# Patient Record
Sex: Female | Born: 1955
Health system: Southern US, Community
[De-identification: ages and names within clinical notes are randomized; demographics above are authoritative.]

## PROBLEM LIST (undated history)

## (undated) DIAGNOSIS — IMO0002 Reserved for concepts with insufficient information to code with codable children: Secondary | ICD-10-CM

## (undated) HISTORY — PX: BLADDER SURGERY: SHX569

## (undated) HISTORY — PX: OTHER SURGICAL HISTORY: SHX169

## (undated) HISTORY — PX: SMALL INTESTINE SURGERY: SHX150

## (undated) HISTORY — PX: ABDOMINAL HYSTERECTOMY: SHX81

---

## 2012-01-13 ENCOUNTER — Emergency Department (HOSPITAL_COMMUNITY)
Admission: EM | Admit: 2012-01-13 | Discharge: 2012-01-13 | Disposition: A | Discharge status: Home / Self Care | Payer: Self-pay | Attending: Emergency Medicine | Admitting: Emergency Medicine

## 2012-01-13 ENCOUNTER — Encounter (HOSPITAL_COMMUNITY): Payer: Self-pay | Admitting: Emergency Medicine

## 2012-01-13 ENCOUNTER — Emergency Department (HOSPITAL_COMMUNITY): Payer: Self-pay

## 2012-01-13 DIAGNOSIS — K089 Disorder of teeth and supporting structures, unspecified: Secondary | ICD-10-CM | POA: Insufficient documentation

## 2012-01-13 DIAGNOSIS — IMO0002 Reserved for concepts with insufficient information to code with codable children: Secondary | ICD-10-CM | POA: Insufficient documentation

## 2012-01-13 DIAGNOSIS — K029 Dental caries, unspecified: Secondary | ICD-10-CM | POA: Insufficient documentation

## 2012-01-13 DIAGNOSIS — J3489 Other specified disorders of nose and nasal sinuses: Secondary | ICD-10-CM | POA: Insufficient documentation

## 2012-01-13 DIAGNOSIS — R221 Localized swelling, mass and lump, neck: Secondary | ICD-10-CM | POA: Insufficient documentation

## 2012-01-13 DIAGNOSIS — R220 Localized swelling, mass and lump, head: Secondary | ICD-10-CM | POA: Insufficient documentation

## 2012-01-13 DIAGNOSIS — K046 Periapical abscess with sinus: Secondary | ICD-10-CM | POA: Insufficient documentation

## 2012-01-13 DIAGNOSIS — R509 Fever, unspecified: Secondary | ICD-10-CM | POA: Insufficient documentation

## 2012-01-13 DIAGNOSIS — R110 Nausea: Secondary | ICD-10-CM | POA: Insufficient documentation

## 2012-01-13 HISTORY — DX: Reserved for concepts with insufficient information to code with codable children: IMO0002

## 2012-01-13 LAB — BASIC METABOLIC PANEL
BUN: 13 mg/dL (ref 6–23)
BUN: 12 mg/dL (ref 6–23)
CO2: 25 mEq/L (ref 19–32)
Chloride: 96 mEq/L (ref 96–112)
Creatinine, Ser: 0.96 mg/dL (ref 0.50–1.10)
Creatinine, Ser: 0.92 mg/dL (ref 0.50–1.10)
GFR calc Af Amer: 79 mL/min — ABNORMAL LOW (ref >90)
GFR calc non Af Amer: 68 mL/min — ABNORMAL LOW (ref >90)
Glucose, Bld: 165 mg/dL — ABNORMAL HIGH (ref 70–99)
Glucose, Bld: 130 mg/dL — ABNORMAL HIGH (ref 70–99)

## 2012-01-13 LAB — CBC WITH DIFFERENTIAL/PLATELET
Basophils Relative: 0 % (ref 0–1)
HCT: 36.7 % (ref 36.0–46.0)
Hemoglobin: 12.5 g/dL (ref 12.0–15.0)
Lymphs Abs: 0.7 10*3/uL (ref 0.7–4.0)
MCHC: 34.1 g/dL (ref 30.0–36.0)
Monocytes Absolute: 1 10*3/uL (ref 0.1–1.0)
Monocytes Relative: 10 % (ref 3–12)
Neutro Abs: 8.4 10*3/uL — ABNORMAL HIGH (ref 1.7–7.7)
RBC: 4.1 MIL/uL (ref 3.87–5.11)

## 2012-01-13 LAB — CBC
HCT: 35.7 % — ABNORMAL LOW (ref 36.0–46.0)
Hemoglobin: 12.2 g/dL (ref 12.0–15.0)
MCH: 30.7 pg (ref 26.0–34.0)
MCHC: 34.2 g/dL (ref 30.0–36.0)
MCV: 89.7 fL (ref 78.0–100.0)
RDW: 13.2 % (ref 11.5–15.5)

## 2012-01-13 MED ORDER — SODIUM CHLORIDE 0.9 % IV SOLN: INTRAVENOUS | Status: DC

## 2012-01-13 MED ORDER — SODIUM CHLORIDE 0.9 % IV BOLUS (SEPSIS)
1000.0000 mL | Freq: Once | INTRAVENOUS | Status: AC
  Administered 2012-01-13: 1000 mL via INTRAVENOUS

## 2012-01-13 MED ORDER — IBUPROFEN 400 MG PO TABS
600.0000 mg | ORAL_TABLET | Freq: Once | ORAL | Status: AC
  Administered 2012-01-13: 600 mg via ORAL
  Filled 2012-01-13: qty 1

## 2012-01-13 MED ORDER — MORPHINE SULFATE 4 MG/ML IJ SOLN
4.0000 mg | Freq: Once | INTRAMUSCULAR | Status: AC
  Administered 2012-01-13: 4 mg via INTRAVENOUS
  Filled 2012-01-13: qty 1

## 2012-01-13 MED ORDER — ACETAMINOPHEN 325 MG PO TABS
650.0000 mg | ORAL_TABLET | Freq: Once | ORAL | Status: AC
  Administered 2012-01-13: 650 mg via ORAL

## 2012-01-13 MED ORDER — SODIUM CHLORIDE 0.9 % IV SOLN
3.0000 g | Freq: Once | INTRAVENOUS | Status: AC
  Administered 2012-01-13: 3 g via INTRAVENOUS
  Filled 2012-01-13 (×2): qty 3

## 2012-01-13 MED ORDER — ACETAMINOPHEN 325 MG PO TABS
ORAL_TABLET | ORAL | Status: AC
  Filled 2012-01-13: qty 2

## 2012-01-13 MED ORDER — IOHEXOL 300 MG/ML  SOLN
80.0000 mL | Freq: Once | INTRAMUSCULAR | Status: AC | PRN
  Administered 2012-01-13: 80 mL via INTRAVENOUS

## 2012-01-13 NOTE — ED Provider Notes (Signed)
History    This chart was scribed for Pamela Roots, MD, MD by Smitty Pluck. The patient was seen in room TR07C and the patient's care was started at 5:41PM.   CSN: [id number]  Arrival date & time 01/13/12  1505   None     Chief Complaint  Patient presents with  . Fever  . Dental Pain    (Consider location/radiation/quality/duration/timing/severity/associated sxs/prior treatment) The history is provided by the patient.   Pamela Wyatt is a 56 y.o. female who presents to the Emergency Department complaining of moderate oral infection and right side facial swelling with fever onset 3 days ago but with symptoms worsening today. Pt reports having sinus pressure and pain recently. Reports that she went to dentist in [place] 2 weeks ago due to dental pain on right side but x-rays were negative for infection/dental abscess. . Pt reports that symptoms have been constant. Denies cough, emesis and diarrhea. Reports having nausea. Other than recent dental pain and recent sinus congestion/pain, denies other recent symptoms or illness. No headache. No neck pain or stiffness. No sore throat or cough. No cp or sob. No abd pain. No nvd. No gu c/o.   Past Medical History  Diagnosis Date  . Herniated disc     History reviewed. No pertinent past surgical history.  History reviewed. No pertinent family history.  History  Substance Use Topics  . Smoking status: Never Smoker   . Smokeless tobacco: Not on file  . Alcohol Use: No    OB History    Grav Para Term Preterm Abortions TAB SAB Ect Mult Living                  Review of Systems  Constitutional: Positive for fever. Negative for chills.  HENT: Positive for congestion, dental problem and sinus pressure. Negative for sore throat, neck pain and neck stiffness.   Eyes: Negative for redness and visual disturbance.  Respiratory: Negative for cough and shortness of breath.   Cardiovascular: Negative for chest pain.  Gastrointestinal:  Negative for nausea, vomiting and abdominal pain.  Genitourinary: Negative for dysuria.  Skin: Negative for rash.  Neurological: Negative for weakness and headaches.    Allergies  Sulfa antibiotics  Home Medications  No current outpatient prescriptions on file.  BP 108/69  Pulse 121  Temp 102.6 F (39.2 C) (Oral)  Resp 16  SpO2 98%  Physical Exam  Nursing note and vitals reviewed. Constitutional: She is oriented to person, place, and time. She appears well-developed and well-nourished. No distress.  HENT:  Head: Normocephalic and atraumatic.  Nose: Nose normal.  Mouth/Throat: Oropharynx is clear and moist.       Teeth 2 and 7 with decay and broken off  mild swelling to adjacent gum area. No trismus. Pharynx normal. No swelling or erythema. No post pharyngeal, floor of mouth, or neck swelling/tenderness. Moderate right facial swelling extending from just beneath right eye to lower cheek. No discrete area of fluctuance/abscess felt.   Eyes: Conjunctivae and EOM are normal. Pupils are equal, round, and reactive to light.  Neck: Neck supple. No tracheal deviation present. No thyromegaly present.       No stiffness or rigidity  Cardiovascular: Regular rhythm, normal heart sounds and intact distal pulses.   Pulmonary/Chest: Effort normal and breath sounds normal. No respiratory distress.  Abdominal: Soft. She exhibits no distension. There is no tenderness.  Genitourinary:       No cva tenderness  Musculoskeletal: Normal range of motion. She  exhibits no edema and no tenderness.  Lymphadenopathy:    She has no cervical adenopathy.  Neurological: She is alert and oriented to person, place, and time.  Skin: Skin is warm and dry. No rash noted.  Psychiatric: She has a normal mood and affect. Her behavior is normal.    ED Course  Procedures (including critical care time) DIAGNOSTIC STUDIES: Oxygen Saturation is 98% on room air, normal by my interpretation.    COORDINATION OF  CARE: 5:52PM EDP ordered medication: tylenol 650 mg, 0.9% NaCl infusion, Unasyn 3 g  Results for orders placed during the hospital encounter of 01/13/12  CBC WITH DIFFERENTIAL      Component Value Range   WBC 10.2  4.0 - 10.5 K/uL   RBC 4.10  3.87 - 5.11 MIL/uL   Hemoglobin 12.5  12.0 - 15.0 g/dL   HCT 40.9  81.1 - 91.4 %   MCV 89.5  78.0 - 100.0 fL   MCH 30.5  26.0 - 34.0 pg   MCHC 34.1  30.0 - 36.0 g/dL   RDW 78.2  95.6 - 21.3 %   Platelets 181  150 - 400 K/uL   Neutrophils Relative 82 (*) 43 - 77 %   Neutro Abs 8.4 (*) 1.7 - 7.7 K/uL   Lymphocytes Relative 7 (*) 12 - 46 %   Lymphs Abs 0.7  0.7 - 4.0 K/uL   Monocytes Relative 10  3 - 12 %   Monocytes Absolute 1.0  0.1 - 1.0 K/uL   Eosinophils Relative 1  0 - 5 %   Eosinophils Absolute 0.1  0.0 - 0.7 K/uL   Basophils Relative 0  0 - 1 %   Basophils Absolute 0.0  0.0 - 0.1 K/uL  BASIC METABOLIC PANEL      Component Value Range   Sodium 135  135 - 145 mEq/L   Potassium 3.8  3.5 - 5.1 mEq/L   Chloride 96  96 - 112 mEq/L   CO2 25  19 - 32 mEq/L   Glucose, Bld 165 (*) 70 - 99 mg/dL   BUN 13  6 - 23 mg/dL   Creatinine, Ser 0.86  0.50 - 1.10 mg/dL   Calcium 9.3  8.4 - 57.8 mg/dL   GFR calc non Af Amer 65 (*) >90 mL/min   GFR calc Af Amer 75 (*) >90 mL/min      MDM  I personally performed the services described in this documentation, which was scribed in my presence. The recorded information has been reviewed and considered. Pamela Roots, MD  Given fever, signif facial swelling, and tachycardia, will get ct mf r/o abscess.   Confirmed only allergy is sulfa. unasyn iv.   Discussed CDU PA Laveda Norman - given fever, tachycardia, ct findings of periapical abscess w erosion into maxillary sinus, will discussed w Oral Surgery/Maxillofacial, possibly admit for iv abx, surgical drainage.     Pamela Roots, MD 01/13/12 2025

## 2012-01-13 NOTE — ED Notes (Signed)
Pt noted to have swelling to right side of face and dental pain x 3 days; pt with fever

## 2012-01-13 NOTE — ED Provider Notes (Signed)
Assumed pt care in CDU.  Pt presents with dental pain, facial swelling and fever.  Maxillofacial CT ordered to evaluate extent of infection.  Unasyn abx and IVF given.  Will recheck VS, monitor, and consider further care once CT resulted.    7:01 PM On exam, pt with moderate edema noted to R maxillofacial region with tenderness to her R upper molar, premolar.  No pain with eye movement, no rash noted.  Pt able to speak without difficulty, no trismus.     8:34 PM I have consulted with oral surgeon Dr. Chales Salmon, who recommend having pt f/u in office tomorrow at 8am for further care.  Plan to continue with IV antibiotic and fluid, and will d/c pt once VS stable.  Pt currently in NAD, able to tolerates PO, and appears nontoxic.  Pain medication given.  Pt agrees with plan.    8:58 PM VSS, in NAD.    BP 102/66  Pulse 97  Temp 98.2 F (36.8 C) (Oral)  Resp 18  SpO2 98%  11:04 PM Pt stable to be discharged.  Pt to f/u with Dr. Chales Salmon tomorrow.    Fayrene Helper, PA-C 01/13/12 2304

## 2012-01-14 NOTE — ED Provider Notes (Signed)
Medical screening examination/treatment/procedure(s) were conducted as a shared visit with non-physician practitioner(s) and myself.  I personally evaluated the patient during the encounter See H and P  Suzi Roots, MD 01/14/12 1558

## 2013-10-01 IMAGING — CT CT MAXILLOFACIAL W/ CM
3 series · 16 of 47 positions shown, 19 images · IV contrast (80ml omni 300)
Comparison: None.

CLINICAL DATA: Right facial swelling and fever.

CT MAXILLOFACIAL WITH CONTRAST
TECHNIQUE: Multidetector CT imaging of the maxillofacial
structures was performed with intravenous contrast. Multiplanar CT
image reconstructions were also generated.
Contrast: 80mL OMNIPAQUE IOHEXOL 300 MG/ML  SOLN

[Series 3: recon 2: supine facial bones · axial · 0.36mm/px · z∈[+7,+157]mm · 10 of 66 slices shown, 13 images]
[im 5/66  brain]
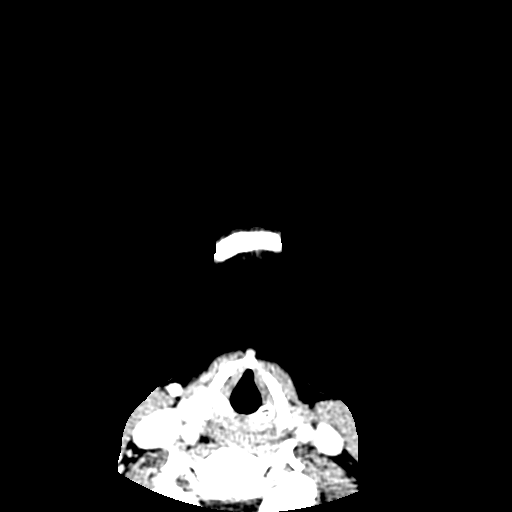
[im 5/66  bone]
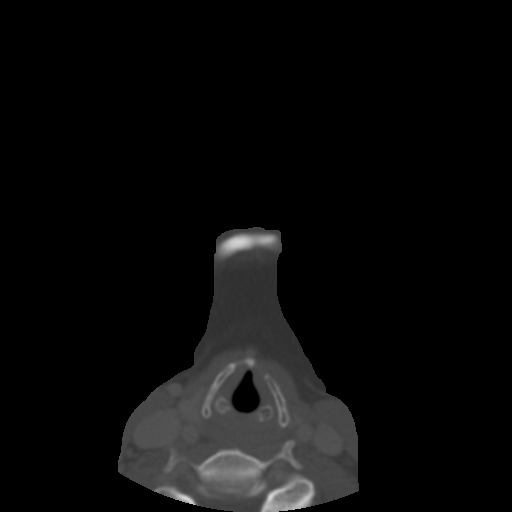
[im 12/66  bone]
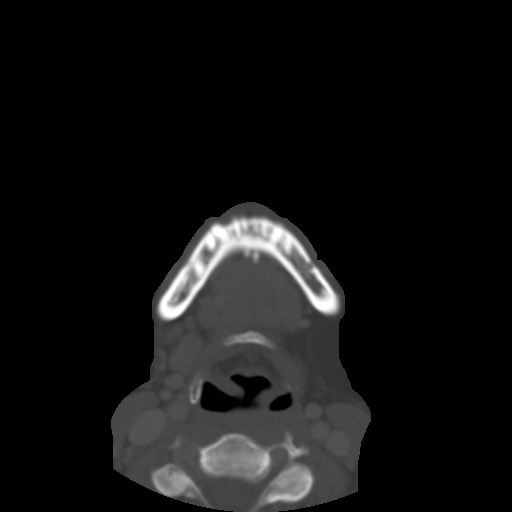
[im 18/66  bone]
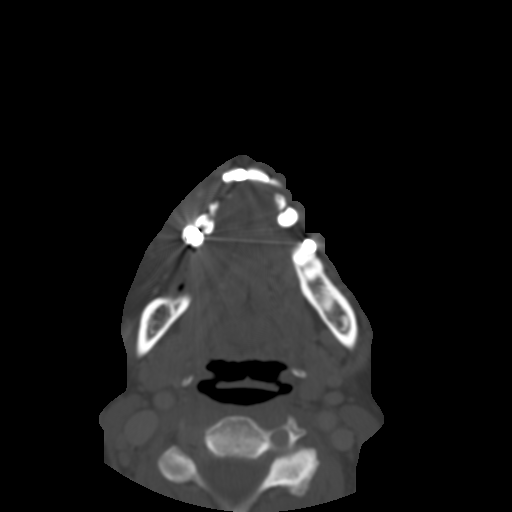
[im 23/66  bone]
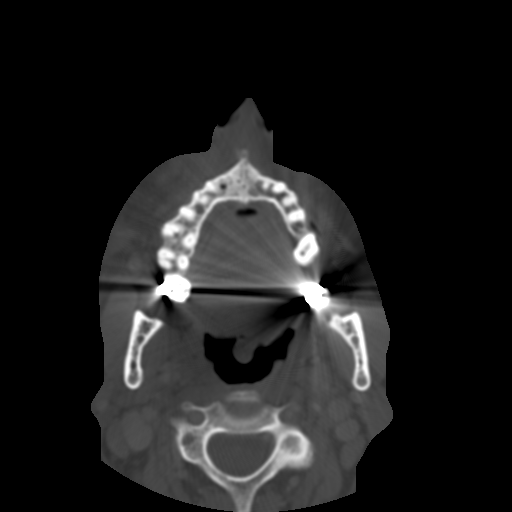
[im 30/66  brain]
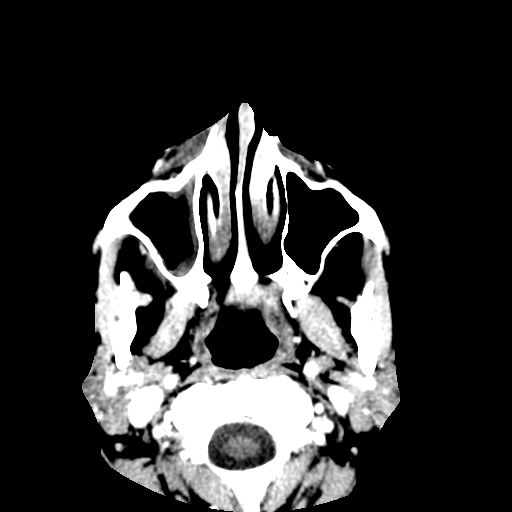
[im 30/66  bone]
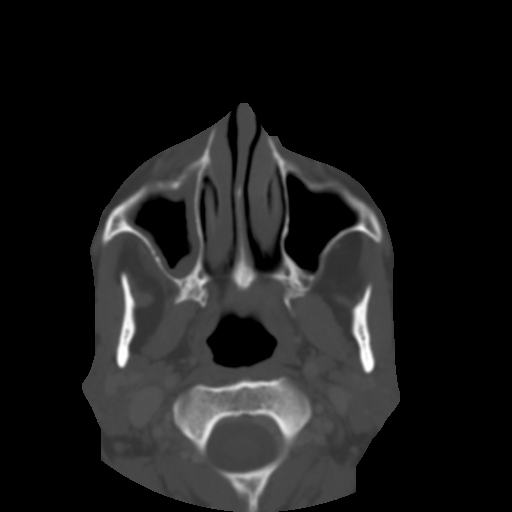
[im 36/66  bone]
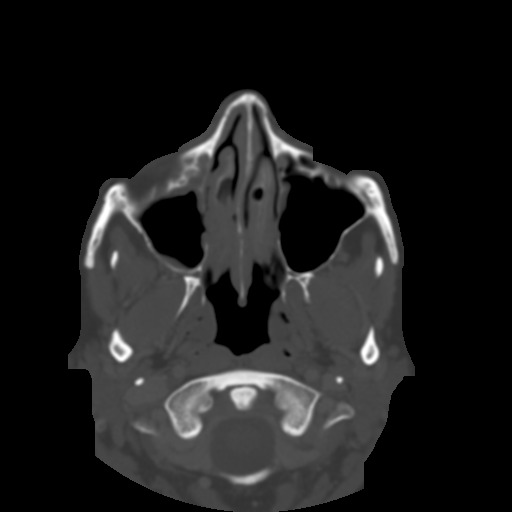
[im 43/66  bone]
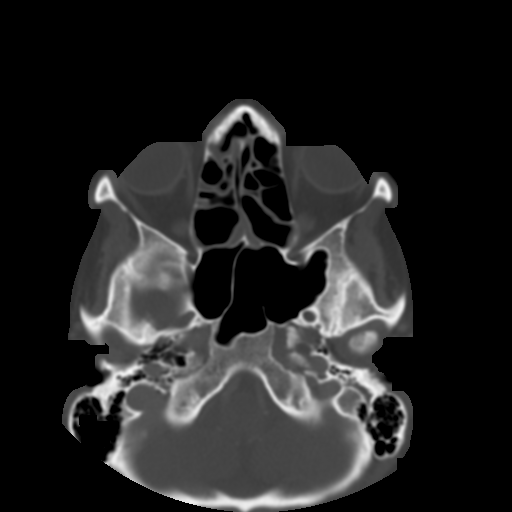
[im 50/66  bone]
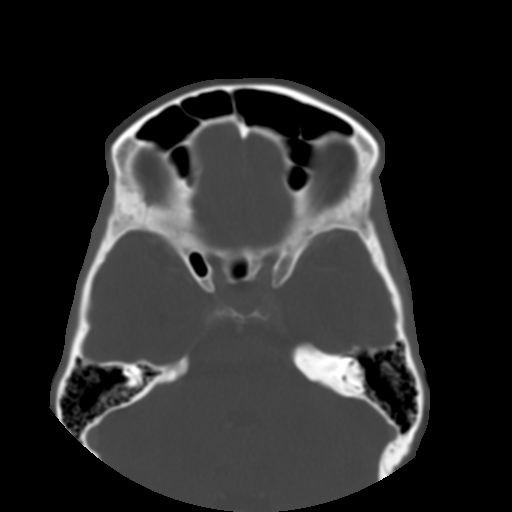
[im 54/66  brain]
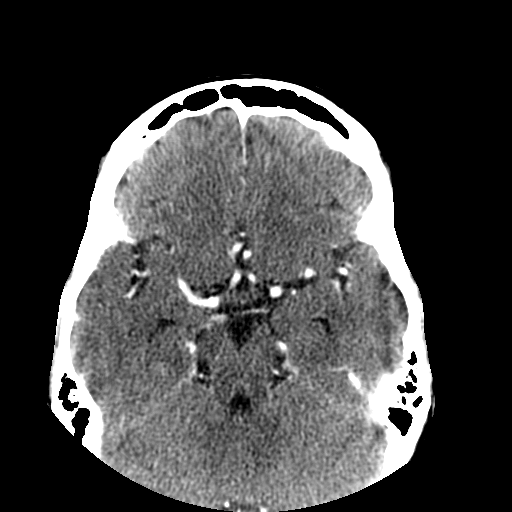
[im 54/66  bone]
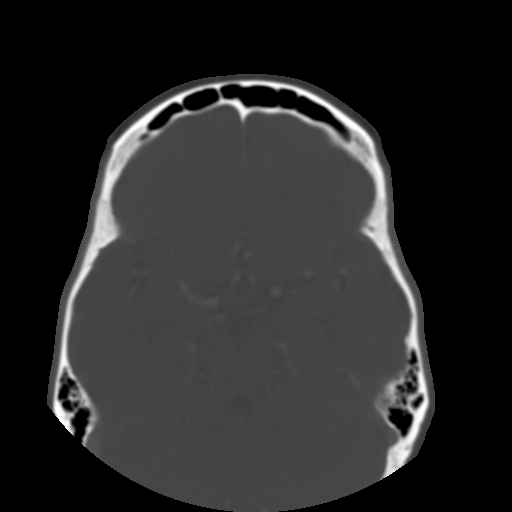
[im 61/66  bone]
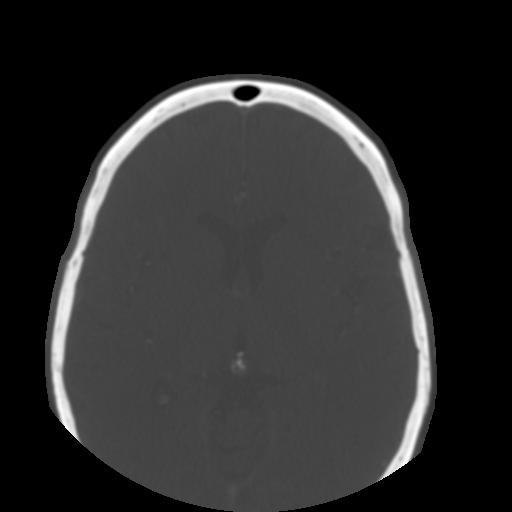

[Series 103: sagittal detail · sagittal · 0.36mm/px · 3 of 49 slices shown]
[im 17/49  bone]
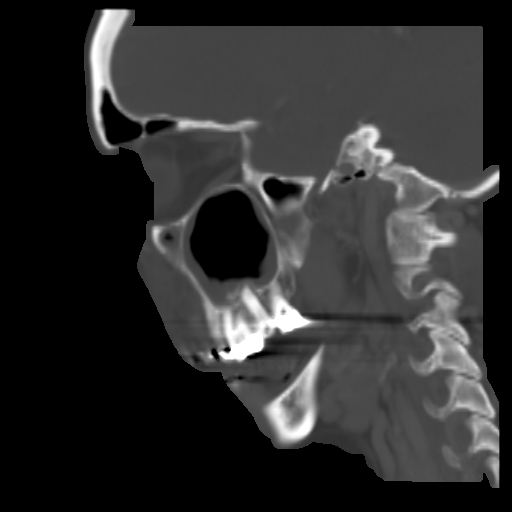
[im 25/49  bone]
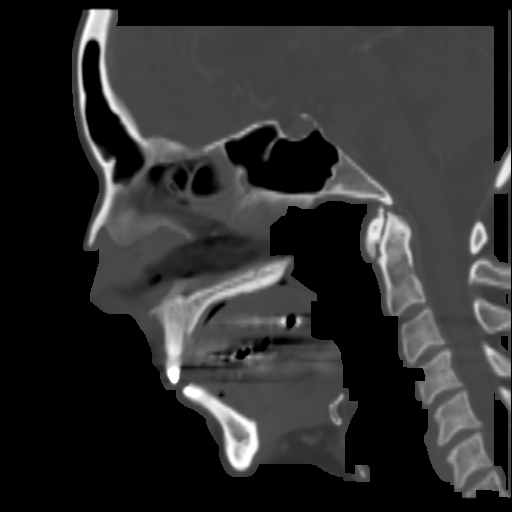
[im 33/49  bone]
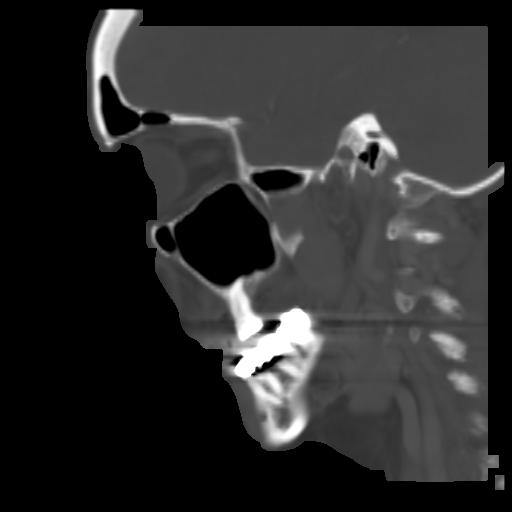

[Series 104: coronal detail · coronal · 0.36mm/px · 3 of 49 slices shown]
[im 17/49  bone]
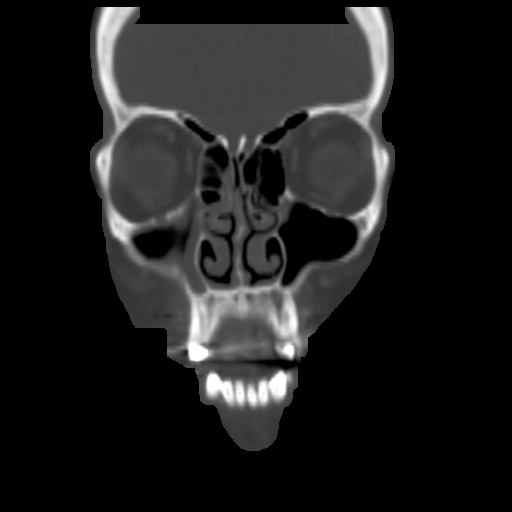
[im 22/49  bone]
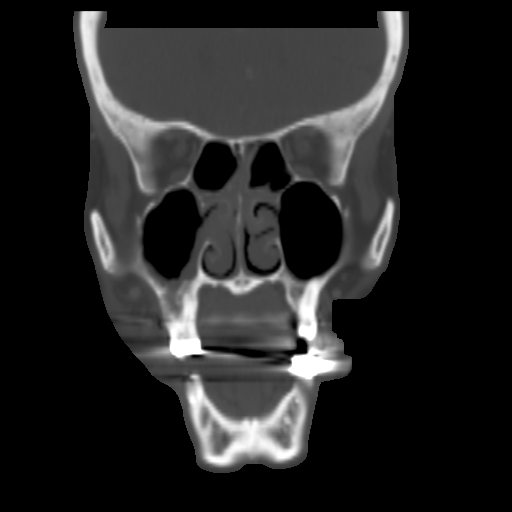
[im 27/49  bone]
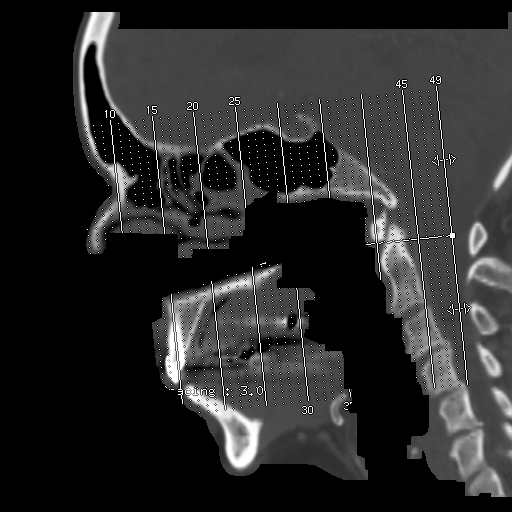

[16 of 47 positions shown; findings below may reference images not displayed]

FINDINGS: The patient has periapical abscesses involving the roots
of teeth #2 and #3 with disruption of the cortex overlying the
roots of the teeth into the right maxillary sinus where there is
reactive mucosal thickening.

The patient has multiple missing teeth.

There is minimal mucosal thickening in the right ethmoid air cells.
The other paranasal sinuses are clear.

No acute abnormality of the mandible.

Slight reactive adenopathy at the right mandibular angle.  Parotid
and submandibular glands are normal.
IMPRESSION: Periapical abscesses involving the roots of teeth #2 and #3 with
disruption of the cortex into the right maxillary sinus.

## 2019-12-14 DIAGNOSIS — R6889 Other general symptoms and signs: Secondary | ICD-10-CM | POA: Diagnosis not present

## 2020-02-02 DIAGNOSIS — Z135 Encounter for screening for eye and ear disorders: Secondary | ICD-10-CM | POA: Diagnosis not present

## 2020-02-02 DIAGNOSIS — H5200 Hypermetropia, unspecified eye: Secondary | ICD-10-CM | POA: Diagnosis not present

## 2020-02-02 DIAGNOSIS — R6889 Other general symptoms and signs: Secondary | ICD-10-CM | POA: Diagnosis not present

## 2020-02-02 DIAGNOSIS — Z0100 Encounter for examination of eyes and vision without abnormal findings: Secondary | ICD-10-CM | POA: Diagnosis not present

## 2020-02-02 DIAGNOSIS — I10 Essential (primary) hypertension: Secondary | ICD-10-CM | POA: Diagnosis not present

## 2020-02-28 DIAGNOSIS — R6889 Other general symptoms and signs: Secondary | ICD-10-CM | POA: Diagnosis not present

## 2020-03-20 DIAGNOSIS — R6889 Other general symptoms and signs: Secondary | ICD-10-CM | POA: Diagnosis not present

## 2020-04-11 DIAGNOSIS — R6889 Other general symptoms and signs: Secondary | ICD-10-CM | POA: Diagnosis not present

## 2020-04-20 DIAGNOSIS — R6889 Other general symptoms and signs: Secondary | ICD-10-CM | POA: Diagnosis not present

## 2020-09-18 DIAGNOSIS — R6889 Other general symptoms and signs: Secondary | ICD-10-CM | POA: Diagnosis not present

## 2020-10-03 ENCOUNTER — Telehealth: Payer: Self-pay

## 2020-10-03 NOTE — Telephone Encounter (Signed)
Called patient and LVM advising her I was calling from [facility] in regards to a question I received about her estimate. Advised patient to call [phone] to discuss.

## 2020-10-26 ENCOUNTER — Ambulatory Visit: Payer: Medicare HMO | Attending: Family Medicine | Admitting: Family Medicine

## 2020-10-26 ENCOUNTER — Encounter: Payer: Self-pay | Admitting: Family Medicine

## 2020-10-26 ENCOUNTER — Other Ambulatory Visit: Payer: Self-pay

## 2020-10-26 VITALS — BP 107/70 | HR 88 | Ht 64.0 in | Wt 132.0 lb

## 2020-10-26 DIAGNOSIS — G2581 Restless legs syndrome: Secondary | ICD-10-CM

## 2020-10-26 DIAGNOSIS — N3000 Acute cystitis without hematuria: Secondary | ICD-10-CM | POA: Diagnosis not present

## 2020-10-26 DIAGNOSIS — R6889 Other general symptoms and signs: Secondary | ICD-10-CM | POA: Diagnosis not present

## 2020-10-26 LAB — POCT URINALYSIS DIP (CLINITEK)
Bilirubin, UA: NEGATIVE
Glucose, UA: NEGATIVE mg/dL
Ketones, POC UA: NEGATIVE mg/dL
Leukocytes, UA: NEGATIVE
Nitrite, UA: POSITIVE — AB
POC PROTEIN,UA: NEGATIVE
Spec Grav, UA: 1.02
Urobilinogen, UA: 0.2 U/dL
pH, UA: 5.5

## 2020-10-26 MED ORDER — GABAPENTIN 300 MG PO CAPS
300.0000 mg | ORAL_CAPSULE | Freq: Every day | ORAL | 3 refills | Status: DC
  Filled 2020-10-26: qty 30, 30d supply, fill #0

## 2020-10-26 MED ORDER — CEPHALEXIN 500 MG PO CAPS
500.0000 mg | ORAL_CAPSULE | Freq: Two times a day (BID) | ORAL | 0 refills | Status: DC
  Filled 2020-10-26: qty 10, 5d supply, fill #0

## 2020-10-26 NOTE — Progress Notes (Signed)
Urine with odor and cloudy. Discuss restless leg syndrome.

## 2020-10-26 NOTE — Progress Notes (Signed)
Subjective:  Patient ID: Pamela Wyatt, female    DOB: [birth year]  Age: 65 y.o. MRN: [id number]  CC: New Patient (Initial Visit)   HPI Pamela Wyatt is a 65 year old female with a history of chronic low back pain status postsurgery several years ago who presents to establish care. Relocated from [place] a year ago For the last 6 months she  has had a cloudy urine with a strong smell but she has no dysuria or frequency.  Denies presence of flank pain, fever, abdominal pain. Complains of restless legs for the last 8 years and she has been unable to sleep due to this.  Past Medical History:  Diagnosis Date  . Herniated disc     Past Surgical History:  Procedure Laterality Date  . ABDOMINAL HYSTERECTOMY    . arm surgery    . BLADDER SURGERY    . etopic pregnancy    . SMALL INTESTINE SURGERY      History reviewed. No pertinent family history.  Allergies  Allergen Reactions  . Sulfa Antibiotics Other (See Comments)    Real fast heart beat    No outpatient medications prior to visit.   No facility-administered medications prior to visit.     ROS Review of Systems  Constitutional: Negative for activity change, appetite change and fatigue.  HENT: Negative for congestion, sinus pressure and sore throat.   Eyes: Negative for visual disturbance.  Respiratory: Negative for cough, chest tightness, shortness of breath and wheezing.   Cardiovascular: Negative for chest pain and palpitations.  Gastrointestinal: Negative for abdominal distention, abdominal pain and constipation.  Endocrine: Negative for polydipsia.  Genitourinary: Negative for dysuria and frequency.  Musculoskeletal: Negative for arthralgias and back pain.  Skin: Negative for rash.  Neurological: Negative for tremors, light-headedness and numbness.  Hematological: Does not bruise/bleed easily.  Psychiatric/Behavioral: Negative for agitation and behavioral problems.    Objective:  BP 107/70   Pulse 88   Ht  _0  (1.626 m)   Wt 132 lb (59.9 kg)   SpO2 100%   BMI 22.66 kg/m   BP/Weight 10/26/2020 4/74/2595  Systolic BP 638 756  Diastolic BP 70 66  Wt. (Lbs) 132 -  BMI 22.66 -      Physical Exam Constitutional:      Appearance: She is well-developed.  Neck:     Vascular: No JVD.  Cardiovascular:     Rate and Rhythm: Normal rate.     Heart sounds: Normal heart sounds. No murmur heard.   Pulmonary:     Effort: Pulmonary effort is normal.     Breath sounds: Normal breath sounds. No wheezing or rales.  Chest:     Chest wall: No tenderness.  Abdominal:     General: Bowel sounds are normal. There is no distension.     Palpations: Abdomen is soft. There is no mass.     Tenderness: There is no abdominal tenderness. There is no right CVA tenderness or left CVA tenderness.  Musculoskeletal:        General: No tenderness. Normal range of motion.     Right lower leg: No edema.     Left lower leg: No edema.  Neurological:     Mental Status: She is alert and oriented to person, place, and time.  Psychiatric:        Mood and Affect: Mood normal.     CMP Latest Ref Rng & Units 01/13/2012 01/13/2012  Glucose 70 - 99 mg/dL 130(H) 165(H)  BUN 6 -  23 mg/dL 12 13  Creatinine 0.50 - 1.10 mg/dL 0.92 0.96  Sodium 135 - 145 mEq/L 138 135  Potassium 3.5 - 5.1 mEq/L 3.7 3.8  Chloride 96 - 112 mEq/L 99 96  CO2 19 - 32 mEq/L 26 25  Calcium 8.4 - 10.5 mg/dL 9.4 9.3    Lipid Panel  No results found for: CHOL, TRIG, HDL, CHOLHDL, VLDL, LDLCALC, LDLDIRECT  CBC    Component Value Date/Time   WBC 10.3 01/13/2012 1800   RBC 3.98 01/13/2012 1800   HGB 12.2 01/13/2012 1800   HCT 35.7 (L) 01/13/2012 1800   PLT 177 01/13/2012 1800   MCV 89.7 01/13/2012 1800   MCH 30.7 01/13/2012 1800   MCHC 34.2 01/13/2012 1800   RDW 13.2 01/13/2012 1800   LYMPHSABS 0.7 01/13/2012 1520   MONOABS 1.0 01/13/2012 1520   EOSABS 0.1 01/13/2012 1520   BASOSABS 0.0 01/13/2012 1520    No results found for:  HGBA1C  Assessment & Plan:  1. Restless leg syndrome Uncontrolled Will initiate gabapentin Reassess at next visit and adjust regimen accordingly - gabapentin (NEURONTIN) 300 MG capsule; Take 1 capsule (300 mg total) by mouth at bedtime.  Dispense: 30 capsule; Refill: 3  2. Acute cystitis without hematuria Could explain her urinary symptoms Placed on Keflex Increase oral hydration - CMP14+EGFR - Lipid panel - CBC with Differential/Platelet - cephALEXin (KEFLEX) 500 MG capsule; Take 1 capsule (500 mg total) by mouth 2 (two) times daily.  Dispense: 10 capsule; Refill: 0 - POCT URINALYSIS DIP (CLINITEK)     Meds ordered this encounter  Medications  . gabapentin (NEURONTIN) 300 MG capsule    Sig: Take 1 capsule (300 mg total) by mouth at bedtime.    Dispense:  30 capsule    Refill:  3  . cephALEXin (KEFLEX) 500 MG capsule    Sig: Take 1 capsule (500 mg total) by mouth 2 (two) times daily.    Dispense:  10 capsule    Refill:  0    Follow-up: Return in about 3 months (around 01/25/2021) for Follow-up on restless legs.       Charlott Rakes, MD, FAAFP. [facility] and [facility], [place]   10/26/2020, 11:23 AM

## 2020-10-27 ENCOUNTER — Encounter: Payer: Self-pay | Admitting: Family Medicine

## 2020-10-27 ENCOUNTER — Other Ambulatory Visit: Payer: Self-pay | Admitting: Family Medicine

## 2020-10-27 LAB — CBC WITH DIFFERENTIAL/PLATELET
Basophils Absolute: 0 10*3/uL (ref 0.0–0.2)
Basos: 1 %
EOS (ABSOLUTE): 0.1 10*3/uL (ref 0.0–0.4)
Eos: 1 %
Hematocrit: 40.7 % (ref 34.0–46.6)
Hemoglobin: 13.6 g/dL (ref 11.1–15.9)
Immature Grans (Abs): 0 10*3/uL (ref 0.0–0.1)
Immature Granulocytes: 0 %
Lymphocytes Absolute: 1.9 10*3/uL (ref 0.7–3.1)
Lymphs: 24 %
MCH: 30.4 pg (ref 26.6–33.0)
MCHC: 33.4 g/dL (ref 31.5–35.7)
MCV: 91 fL (ref 79–97)
Monocytes Absolute: 0.4 10*3/uL (ref 0.1–0.9)
Monocytes: 5 %
Neutrophils Absolute: 5.7 10*3/uL (ref 1.4–7.0)
Neutrophils: 69 %
Platelets: 288 10*3/uL (ref 150–450)
RBC: 4.48 x10E6/uL (ref 3.77–5.28)
RDW: 13.1 % (ref 11.7–15.4)
WBC: 8.1 10*3/uL (ref 3.4–10.8)

## 2020-10-27 LAB — CMP14+EGFR
ALT: 16 IU/L (ref 0–32)
AST: 15 IU/L (ref 0–40)
Albumin/Globulin Ratio: 2 (ref 1.2–2.2)
Albumin: 4.7 g/dL (ref 3.8–4.8)
Alkaline Phosphatase: 95 IU/L (ref 44–121)
BUN/Creatinine Ratio: 15 (ref 12–28)
BUN: 15 mg/dL (ref 8–27)
Bilirubin Total: 0.4 mg/dL (ref 0.0–1.2)
CO2: 22 mmol/L (ref 20–29)
Calcium: 10.2 mg/dL (ref 8.7–10.3)
Chloride: 103 mmol/L (ref 96–106)
Creatinine, Ser: 1.02 mg/dL — ABNORMAL HIGH (ref 0.57–1.00)
Globulin, Total: 2.3 g/dL (ref 1.5–4.5)
Glucose: 102 mg/dL — ABNORMAL HIGH (ref 65–99)
Potassium: 4.8 mmol/L (ref 3.5–5.2)
Sodium: 141 mmol/L (ref 134–144)
Total Protein: 7 g/dL (ref 6.0–8.5)
eGFR: 61 mL/min/{1.73_m2} (ref >59)

## 2020-10-27 LAB — LIPID PANEL
Chol/HDL Ratio: 5.2 ratio — ABNORMAL HIGH (ref 0.0–4.4)
Cholesterol, Total: 261 mg/dL — ABNORMAL HIGH (ref 100–199)
HDL: 50 mg/dL (ref >39)
LDL Chol Calc (NIH): 182 mg/dL — ABNORMAL HIGH (ref 0–99)
Triglycerides: 155 mg/dL — ABNORMAL HIGH (ref 0–149)
VLDL Cholesterol Cal: 29 mg/dL (ref 5–40)

## 2020-10-27 MED ORDER — ATORVASTATIN CALCIUM 20 MG PO TABS: 20.0000 mg | ORAL_TABLET | Freq: Every day | ORAL | 1 refills | Status: DC

## 2020-11-06 DIAGNOSIS — R6889 Other general symptoms and signs: Secondary | ICD-10-CM | POA: Diagnosis not present

## 2020-11-16 ENCOUNTER — Encounter: Payer: Self-pay | Admitting: Family Medicine

## 2020-11-16 ENCOUNTER — Other Ambulatory Visit: Payer: Self-pay | Admitting: Family Medicine

## 2020-11-16 DIAGNOSIS — G2581 Restless legs syndrome: Secondary | ICD-10-CM

## 2020-11-16 MED ORDER — GABAPENTIN 300 MG PO CAPS: 600.0000 mg | ORAL_CAPSULE | Freq: Every day | ORAL | 3 refills | Status: AC

## 2020-11-28 ENCOUNTER — Encounter: Payer: Self-pay | Admitting: Family Medicine

## 2020-11-30 ENCOUNTER — Telehealth: Payer: Self-pay | Admitting: Family Medicine

## 2020-11-30 ENCOUNTER — Encounter: Payer: Self-pay | Admitting: Family Medicine

## 2020-11-30 NOTE — Telephone Encounter (Signed)
Copied from CRM [id number]. Topic: Referral - Request for Referral >> Nov 30, 2020 10:59 AM Gaetana Michaelis A wrote: Has patient seen PCP for this complaint? No  *If NO, is insurance requiring patient see PCP for this issue before PCP can refer them?  Referral for which specialty: Gastroenterology   Preferred provider/office: Patient has no preference   Reason for referral: Patient would like a colonoscopy

## 2020-11-30 NOTE — Telephone Encounter (Signed)
Orders will be placed at patient medicare wellness exam in July.

## 2020-11-30 NOTE — Telephone Encounter (Signed)
Copied from CRM [id number]. Topic: Referral - Request for Referral >> Nov 30, 2020 10:58 AM Gaetana Michaelis A wrote: Has patient seen PCP for this complaint? No  *If NO, is insurance requiring patient see PCP for this issue before PCP can refer them?  Referral for which specialty: Imaging and Radiology  Preferred provider/office: Patient has no preference  Reason for referral: Patient would like a mammogram

## 2020-12-05 ENCOUNTER — Encounter: Payer: Self-pay | Admitting: Family Medicine

## 2020-12-06 DIAGNOSIS — Z23 Encounter for immunization: Secondary | ICD-10-CM | POA: Diagnosis not present

## 2020-12-06 DIAGNOSIS — R6889 Other general symptoms and signs: Secondary | ICD-10-CM | POA: Diagnosis not present

## 2020-12-12 DIAGNOSIS — R6889 Other general symptoms and signs: Secondary | ICD-10-CM | POA: Diagnosis not present

## 2020-12-21 DIAGNOSIS — Z1211 Encounter for screening for malignant neoplasm of colon: Secondary | ICD-10-CM

## 2021-01-08 ENCOUNTER — Encounter: Payer: Self-pay | Admitting: Gastroenterology

## 2021-01-11 ENCOUNTER — Ambulatory Visit: Payer: Medicare HMO

## 2021-01-16 DIAGNOSIS — K006 Disturbances in tooth eruption: Secondary | ICD-10-CM | POA: Diagnosis not present

## 2021-01-16 DIAGNOSIS — R69 Illness, unspecified: Secondary | ICD-10-CM | POA: Diagnosis not present

## 2021-01-22 ENCOUNTER — Ambulatory Visit: Payer: Medicare HMO | Attending: Family Medicine | Admitting: Family Medicine

## 2021-01-22 ENCOUNTER — Encounter: Payer: Self-pay | Admitting: Family Medicine

## 2021-01-22 ENCOUNTER — Other Ambulatory Visit: Payer: Self-pay

## 2021-01-22 VITALS — BP 148/79 | HR 67 | Ht 64.0 in | Wt 127.2 lb

## 2021-01-22 DIAGNOSIS — Z1231 Encounter for screening mammogram for malignant neoplasm of breast: Secondary | ICD-10-CM

## 2021-01-22 DIAGNOSIS — E2839 Other primary ovarian failure: Secondary | ICD-10-CM | POA: Diagnosis not present

## 2021-01-22 DIAGNOSIS — Z23 Encounter for immunization: Secondary | ICD-10-CM

## 2021-01-22 DIAGNOSIS — R030 Elevated blood-pressure reading, without diagnosis of hypertension: Secondary | ICD-10-CM | POA: Diagnosis not present

## 2021-01-22 DIAGNOSIS — G2581 Restless legs syndrome: Secondary | ICD-10-CM | POA: Diagnosis not present

## 2021-01-22 DIAGNOSIS — Z0000 Encounter for general adult medical examination without abnormal findings: Secondary | ICD-10-CM | POA: Diagnosis not present

## 2021-01-22 DIAGNOSIS — R6889 Other general symptoms and signs: Secondary | ICD-10-CM | POA: Diagnosis not present

## 2021-01-22 NOTE — Patient Instructions (Signed)
  Ms. Pamela Wyatt , Thank you for taking time to come for your Medicare Wellness Visit. I appreciate your ongoing commitment to your health goals. Please review the following plan we discussed and let me know if I can assist you in the future.   These are the goals we discussed:  Goals   None     This is a list of the screening recommended for you and due dates:  Health Maintenance  Topic Date Due   HIV Screening  Never done   Hepatitis C Screening: USPSTF Recommendation to screen - Ages 56-79 yo.  Never done   Tetanus Vaccine  Never done   Colon Cancer Screening  Never done   Mammogram  Never done   Zoster (Shingles) Vaccine (1 of 2) Never done   COVID-19 Vaccine (3 - Booster for Pfizer series) 09/09/2020   DEXA scan (bone density measurement)  Never done   Pneumonia vaccines (1 of 2 - PCV13) Never done   Flu Shot  01/29/2021   HPV Vaccine  Aged Out   Pap Smear  Discontinued

## 2021-01-22 NOTE — Progress Notes (Signed)
Subjective:   Pamela Wyatt is a 65 y.o. female who presents for Medicare Annual (Subsequent) preventive examination. Has an upcoming appointment for Colonoscopy. She is due for mammogram.  Pap smear not indicated due to hysterectomy  She had complained of Restless legs for which I placed her on Gabapentin to which she develped a rash Tried up to 7 medicatins for Restless legs with her previous PCP which were ineffective and Klonopin was the only medication that worked. States she did not do well on a Dopamine agonist.  Review of Systems    General: negative for fever, weight loss, appetite change Eyes: no visual symptoms. ENT: no ear symptoms, no sinus tenderness, no nasal congestion or sore throat. Neck: no pain  Respiratory: no wheezing, shortness of breath, cough Cardiovascular: no chest pain, no dyspnea on exertion, no pedal edema, no orthopnea. Gastrointestinal: no abdominal pain, no diarrhea, no constipation Genito-Urinary: no urinary frequency, no dysuria, no polyuria. Hematologic: no bruising Endocrine: no cold or heat intolerance Neurological: no headaches, no seizures, + restless legs Musculoskeletal: no joint pains, no joint swelling Skin: no pruritus, no rash. Psychological: no depression, no anxiety,         Objective:    Today's Vitals   01/22/21 1007  BP: (!) 148/79  Pulse: 67  SpO2: 100%  Weight: 127 lb 3.2 oz (57.7 kg)  Height: 5\' 4"  (1.626 m)   Body mass index is 21.83 kg/m.  Advanced Directives 01/22/2021  Does Patient Have a Medical Advance Directive? Yes    Current Medications (verified) Outpatient Encounter Medications as of 01/22/2021  Medication Sig   atorvastatin (LIPITOR) 20 MG tablet Take 1 tablet (20 mg total) by mouth daily.   cephALEXin (KEFLEX) 500 MG capsule Take 1 capsule (500 mg total) by mouth 2 (two) times daily.   gabapentin (NEURONTIN) 300 MG capsule Take 2 capsules (600 mg total) by mouth at bedtime.   No facility-administered  encounter medications on file as of 01/22/2021.    Allergies (verified) Sulfa antibiotics and Gabapentin   History: Past Medical History:  Diagnosis Date   Herniated disc    Past Surgical History:  Procedure Laterality Date   ABDOMINAL HYSTERECTOMY     arm surgery     BLADDER SURGERY     etopic pregnancy     SMALL INTESTINE SURGERY     History reviewed. No pertinent family history. Social History   Socioeconomic History   Marital status: Single    Spouse name: Not on file   Number of children: Not on file   Years of education: Not on file   Highest education level: Not on file  Occupational History   Not on file  Tobacco Use   Smoking status: Never   Smokeless tobacco: Never  Substance and Sexual Activity   Alcohol use: No   Drug use: No   Sexual activity: Not on file  Other Topics Concern   Not on file  Social History Narrative   Not on file   Social Determinants of Health   Financial Resource Strain: Not on file  Food Insecurity: Not on file  Transportation Needs: Not on file  Physical Activity: Not on file  Stress: Not on file  Social Connections: Not on file    Tobacco Counseling Counseling given: Not Answered   Clinical Intake:  Pre-visit preparation completed: No  Pain : No/denies pain     Diabetes: No      Interpreter Needed?: No      Activities  of Daily Living In your present state of health, do you have any difficulty performing the following activities: 01/22/2021  Hearing? N  Vision? N  Difficulty concentrating or making decisions? N  Walking or climbing stairs? N  Dressing or bathing? N  Doing errands, shopping? N  Preparing Food and eating ? N  Using the Toilet? N  In the past six months, have you accidently leaked urine? N  Do you have problems with loss of bowel control? N  Managing your Medications? N  Managing your Finances? N  Housekeeping or managing your Housekeeping? N  Some recent data might be hidden     Patient Care Team: Hoy Register, MD as PCP - General (Family Medicine)  Indicate any recent Medical Services you may have received from other than [facility] providers in the past year (date may be approximate).  Constitutional: normal appearing,  Eyes: PERRLA HEENT: Head is atraumatic, normal sinuses, normal oropharynx, normal appearing tonsils and palate, tympanic membrane is normal bilaterally. Neck: normal range of motion, no thyromegaly, no JVD Cardiovascular: normal rate and rhythm, normal heart sounds, no murmurs, rub or gallop, no pedal edema Respiratory: Normal breath sounds, clear to auscultation bilaterally, no wheezes, no rales, no rhonchi Breast: Normal appearance, no tenderness, no masses Abdomen: soft, not tender to palpation, normal bowel sounds, no enlarged organs Musculoskeletal: Full ROM, no tenderness in joints Skin: warm and dry, no lesions. Neurological: alert, oriented x3, cranial nerves I-XII grossly intact , normal motor strength, normal sensation. Psychological: normal mood.     Assessment:   This is a routine wellness examination for [facility].  Hearing/Vision screen Vision Screening   Right eye Left eye Both eyes  Without correction 20/40 20/30   With correction 20/25 20/30     Dietary issues and exercise activities discussed:     Goals Addressed   None    Depression Screen PHQ 2/9 Scores 01/22/2021 10/26/2020  PHQ - 2 Score 0 0  PHQ- 9 Score - 3    Fall Risk Fall Risk  01/22/2021 10/26/2020  Falls in the past year? 0 0  Number falls in past yr: 0 0  Injury with Fall? 0 0    FALL RISK PREVENTION PERTAINING TO THE HOME:  Any stairs in or around the home? Yes  If so, are there any without handrails? No  Home free of loose throw rugs in walkways, pet beds, electrical cords, etc? Yes  Adequate lighting in your home to reduce risk of falls? Yes   ASSISTIVE DEVICES UTILIZED TO PREVENT FALLS:  Life alert? No  Use of a cane, walker or w/c? No   Grab bars in the bathroom? No  Shower chair or bench in shower? No  Elevated toilet seat or a handicapped toilet? No   TIMED UP AND GO:  Was the test performed? Yes .  Length of time to ambulate 10 feet: 7 sec.   Gait steady and fast without use of assistive device  Cognitive Function:        Immunizations Immunization History  Administered Date(s) Administered   PFIZER(Purple Top)SARS-COV-2 Vaccination 03/20/2020, 04/11/2020   PNEUMOCOCCAL CONJUGATE-20 01/22/2021   Screening Tests Health Maintenance  Topic Date Due   HIV Screening  Never done   Hepatitis C Screening  Never done   TETANUS/TDAP  Never done   COLONOSCOPY (Pts 45-8yrs Insurance coverage will need to be confirmed)  Never done   MAMMOGRAM  Never done   Zoster Vaccines- Shingrix (1 of 2) Never done  COVID-19 Vaccine (3 - Booster for Pfizer series) 09/09/2020   DEXA SCAN  Never done   PNA vac Low Risk Adult (1 of 2 - PCV13) 11/20/2020   INFLUENZA VACCINE  01/29/2021   HPV VACCINES  Aged Out   PAP SMEAR-Modifier  Discontinued    Health Maintenance  Health Maintenance Due  Topic Date Due   HIV Screening  Never done   Hepatitis C Screening  Never done   TETANUS/TDAP  Never done   COLONOSCOPY (Pts 45-38yrs Insurance coverage will need to be confirmed)  Never done   MAMMOGRAM  Never done   Zoster Vaccines- Shingrix (1 of 2) Never done   COVID-19 Vaccine (3 - Booster for Pfizer series) 09/09/2020   DEXA SCAN  Never done   PNA vac Low Risk Adult (1 of 2 - PCV13) 11/20/2020    Dental Screening: Recommended annual dental exams for proper oral hygiene  Community Resource Referral / Chronic Care Management: CRR required this visit?  No   CCM required this visit?  No      Plan:   1. Encounter for Medicare annual wellness exam Counseled on 150 minutes of exercise per week, healthy eating (including decreased daily intake of saturated fats, cholesterol, added sugars, routine healthcare  maintenance.  - Pneumococcal conjugate vaccine 20-valent  2. Estrogen deficiency - DG Bone Density; Future  3. Encounter for screening mammogram for malignant neoplasm of breast - MM 3D SCREEN BREAST BILATERAL; Future  4. Restless leg syndrome Uncontrolled Unable to tolerate gabapentin and Requip I have referred to neurology - Ambulatory referral to Neurology  5. Elevated blood pressure reading Elevated Blood pressure at last visit was controlled  Regimen change today with advised to work on lifestyle modification Counseled on blood pressure goal of less than 130/80, low-sodium, DASH diet, medication compliance, 150 minutes of moderate intensity exercise per week. Discussed medication compliance, adverse effects.   I have personally reviewed and noted the following in the patient's chart:   Medical and social history Use of alcohol, tobacco or illicit drugs  Current medications and supplements including opioid prescriptions.  Functional ability and status Nutritional status Physical activity Advanced directives List of other physicians Hospitalizations, surgeries, and ER visits in previous 12 months Vitals Screenings to include cognitive, depression, and falls Referrals and appointments  In addition, I have reviewed and discussed with patient certain preventive protocols, quality metrics, and best practice recommendations. A written personalized care plan for preventive services as well as general preventive health recommendations were provided to patient.     Hoy Register, MD   01/22/2021

## 2021-01-25 ENCOUNTER — Ambulatory Visit: Payer: Medicare HMO | Admitting: Family Medicine

## 2021-02-09 ENCOUNTER — Ambulatory Visit: Payer: Medicare HMO | Admitting: Gastroenterology

## 2021-02-20 ENCOUNTER — Other Ambulatory Visit: Payer: Self-pay | Admitting: Family Medicine

## 2021-02-20 DIAGNOSIS — N3000 Acute cystitis without hematuria: Secondary | ICD-10-CM

## 2021-02-20 DIAGNOSIS — R6889 Other general symptoms and signs: Secondary | ICD-10-CM | POA: Diagnosis not present

## 2021-02-20 NOTE — Telephone Encounter (Signed)
Requested medication (s) are due for refill today: yes  Requested medication (s) are on the active medication list: yes  Last refill:  10/26/20 #10  Future visit scheduled: yes  Notes to clinic:  medication not assigned to a protocol   Requested Prescriptions  Pending Prescriptions Disp Refills   cephALEXin (KEFLEX) 500 MG capsule 10 capsule 0    Sig: Take 1 capsule (500 mg total) by mouth 2 (two) times daily.     Off-Protocol Failed - 02/20/2021  4:18 PM      Failed - Medication not assigned to a protocol, review manually.      Passed - Valid encounter within last 12 months    Recent Outpatient Visits           4 weeks ago Encounter for [employer] annual wellness exam   [facility] [place], Odette Horns, MD   3 months ago Restless leg syndrome   [facility] Hoy Register, MD       Future Appointments             In 5 months Hoy Register, MD [facility]

## 2021-02-23 ENCOUNTER — Encounter: Payer: Self-pay | Admitting: Family Medicine

## 2021-02-23 ENCOUNTER — Other Ambulatory Visit: Payer: Self-pay | Admitting: Family Medicine

## 2021-02-23 ENCOUNTER — Other Ambulatory Visit: Payer: Self-pay

## 2021-02-23 DIAGNOSIS — N3000 Acute cystitis without hematuria: Secondary | ICD-10-CM

## 2021-02-23 MED ORDER — CEPHALEXIN 500 MG PO CAPS: 500.0000 mg | ORAL_CAPSULE | Freq: Two times a day (BID) | ORAL | 0 refills | Status: AC

## 2021-02-24 ENCOUNTER — Other Ambulatory Visit: Payer: Self-pay | Admitting: Family Medicine

## 2021-02-24 NOTE — Telephone Encounter (Signed)
Requested Prescriptions  Pending Prescriptions Disp Refills  . atorvastatin (LIPITOR) 20 MG tablet [Pharmacy Med Name: ATORVASTATIN CALCIUM 20 MG Tablet] 90 tablet 1    Sig: TAKE 1 TABLET EVERY DAY     Cardiovascular:  Antilipid - Statins Failed - 02/24/2021 12:34 PM      Failed - Total Cholesterol in normal range and within 360 days    Cholesterol, Total  Date Value Ref Range Status  10/26/2020 261 (H) 100 - 199 mg/dL Final         Failed - LDL in normal range and within 360 days    LDL Chol Calc (NIH)  Date Value Ref Range Status  10/26/2020 182 (H) 0 - 99 mg/dL Final         Failed - Triglycerides in normal range and within 360 days    Triglycerides  Date Value Ref Range Status  10/26/2020 155 (H) 0 - 149 mg/dL Final         Passed - HDL in normal range and within 360 days    HDL  Date Value Ref Range Status  10/26/2020 50 >39 mg/dL Final         Passed - Patient is not pregnant      Passed - Valid encounter within last 12 months    Recent Outpatient Visits          1 month ago Encounter for [employer] annual wellness exam   [facility] [place], Odette Horns, MD   4 months ago Restless leg syndrome   [facility] [place], Odette Horns, MD      Future Appointments            In 5 months Hoy Register, MD [facility]

## 2021-03-01 ENCOUNTER — Encounter: Payer: Self-pay | Admitting: Family Medicine

## 2021-03-14 DIAGNOSIS — R6889 Other general symptoms and signs: Secondary | ICD-10-CM | POA: Diagnosis not present

## 2021-04-04 DIAGNOSIS — R6889 Other general symptoms and signs: Secondary | ICD-10-CM | POA: Diagnosis not present

## 2021-04-18 ENCOUNTER — Ambulatory Visit: Payer: Medicare HMO | Admitting: Neurology

## 2021-04-25 DIAGNOSIS — R6889 Other general symptoms and signs: Secondary | ICD-10-CM | POA: Diagnosis not present

## 2021-05-17 DIAGNOSIS — R6889 Other general symptoms and signs: Secondary | ICD-10-CM | POA: Diagnosis not present

## 2021-07-25 ENCOUNTER — Ambulatory Visit: Payer: Medicare Other | Admitting: Family Medicine

## 2021-08-03 ENCOUNTER — Ambulatory Visit: Payer: Medicare HMO

## 2021-08-03 ENCOUNTER — Other Ambulatory Visit: Payer: Medicare Other

## 2021-08-08 ENCOUNTER — Other Ambulatory Visit: Payer: Self-pay

## 2021-09-06 ENCOUNTER — Telehealth: Payer: Self-pay

## 2021-09-06 NOTE — Telephone Encounter (Signed)
Patient contacted to schedule mammogram.  ?Unable to leave a message on the number listed.  ? ?RE: Mobile Mammo event located at: ? ?[email or web address]  ?      ?[address]    ?[place] [address]    ? ?Date: April 7th  ? ?

## 2022-01-03 DIAGNOSIS — H2513 Age-related nuclear cataract, bilateral: Secondary | ICD-10-CM | POA: Diagnosis not present

## 2022-01-04 DIAGNOSIS — D539 Nutritional anemia, unspecified: Secondary | ICD-10-CM | POA: Diagnosis not present

## 2022-01-04 DIAGNOSIS — R0602 Shortness of breath: Secondary | ICD-10-CM | POA: Diagnosis not present

## 2022-01-04 DIAGNOSIS — Z1231 Encounter for screening mammogram for malignant neoplasm of breast: Secondary | ICD-10-CM | POA: Diagnosis not present

## 2022-01-04 DIAGNOSIS — Z1211 Encounter for screening for malignant neoplasm of colon: Secondary | ICD-10-CM | POA: Diagnosis not present

## 2022-01-04 DIAGNOSIS — E559 Vitamin D deficiency, unspecified: Secondary | ICD-10-CM | POA: Diagnosis not present

## 2022-01-04 DIAGNOSIS — Z1159 Encounter for screening for other viral diseases: Secondary | ICD-10-CM | POA: Diagnosis not present

## 2022-01-04 DIAGNOSIS — Z780 Asymptomatic menopausal state: Secondary | ICD-10-CM | POA: Diagnosis not present

## 2022-01-04 DIAGNOSIS — Z79899 Other long term (current) drug therapy: Secondary | ICD-10-CM | POA: Diagnosis not present

## 2022-01-04 DIAGNOSIS — E7800 Pure hypercholesterolemia, unspecified: Secondary | ICD-10-CM | POA: Diagnosis not present

## 2022-01-04 DIAGNOSIS — M129 Arthropathy, unspecified: Secondary | ICD-10-CM | POA: Diagnosis not present

## 2022-01-04 DIAGNOSIS — Z0000 Encounter for general adult medical examination without abnormal findings: Secondary | ICD-10-CM | POA: Diagnosis not present

## 2022-01-04 DIAGNOSIS — R5383 Other fatigue: Secondary | ICD-10-CM | POA: Diagnosis not present

## 2022-01-10 DIAGNOSIS — G2581 Restless legs syndrome: Secondary | ICD-10-CM | POA: Diagnosis not present

## 2022-01-10 DIAGNOSIS — R768 Other specified abnormal immunological findings in serum: Secondary | ICD-10-CM | POA: Diagnosis not present

## 2022-01-10 DIAGNOSIS — Z1231 Encounter for screening mammogram for malignant neoplasm of breast: Secondary | ICD-10-CM | POA: Diagnosis not present

## 2022-01-10 DIAGNOSIS — E7800 Pure hypercholesterolemia, unspecified: Secondary | ICD-10-CM | POA: Diagnosis not present

## 2022-01-10 DIAGNOSIS — Z79899 Other long term (current) drug therapy: Secondary | ICD-10-CM | POA: Diagnosis not present

## 2022-01-10 DIAGNOSIS — E559 Vitamin D deficiency, unspecified: Secondary | ICD-10-CM | POA: Diagnosis not present

## 2022-01-10 DIAGNOSIS — Z780 Asymptomatic menopausal state: Secondary | ICD-10-CM | POA: Diagnosis not present

## 2022-01-11 DIAGNOSIS — Z780 Asymptomatic menopausal state: Secondary | ICD-10-CM | POA: Diagnosis not present

## 2022-01-24 DIAGNOSIS — Z1211 Encounter for screening for malignant neoplasm of colon: Secondary | ICD-10-CM | POA: Diagnosis not present

## 2022-01-24 DIAGNOSIS — Z8371 Family history of colonic polyps: Secondary | ICD-10-CM | POA: Diagnosis not present

## 2022-02-07 DIAGNOSIS — E559 Vitamin D deficiency, unspecified: Secondary | ICD-10-CM | POA: Diagnosis not present

## 2022-02-07 DIAGNOSIS — Z1231 Encounter for screening mammogram for malignant neoplasm of breast: Secondary | ICD-10-CM | POA: Diagnosis not present

## 2022-02-07 DIAGNOSIS — M8580 Other specified disorders of bone density and structure, unspecified site: Secondary | ICD-10-CM | POA: Diagnosis not present

## 2022-02-07 DIAGNOSIS — E7800 Pure hypercholesterolemia, unspecified: Secondary | ICD-10-CM | POA: Diagnosis not present

## 2022-02-07 DIAGNOSIS — Z780 Asymptomatic menopausal state: Secondary | ICD-10-CM | POA: Diagnosis not present

## 2022-02-07 DIAGNOSIS — Z79899 Other long term (current) drug therapy: Secondary | ICD-10-CM | POA: Diagnosis not present

## 2022-02-07 DIAGNOSIS — G2581 Restless legs syndrome: Secondary | ICD-10-CM | POA: Diagnosis not present

## 2022-02-15 DIAGNOSIS — Z1211 Encounter for screening for malignant neoplasm of colon: Secondary | ICD-10-CM | POA: Diagnosis not present

## 2022-03-11 DIAGNOSIS — E559 Vitamin D deficiency, unspecified: Secondary | ICD-10-CM | POA: Diagnosis not present

## 2022-03-11 DIAGNOSIS — Z79899 Other long term (current) drug therapy: Secondary | ICD-10-CM | POA: Diagnosis not present

## 2022-03-11 DIAGNOSIS — G2581 Restless legs syndrome: Secondary | ICD-10-CM | POA: Diagnosis not present

## 2022-03-11 DIAGNOSIS — Z780 Asymptomatic menopausal state: Secondary | ICD-10-CM | POA: Diagnosis not present

## 2022-03-11 DIAGNOSIS — E7800 Pure hypercholesterolemia, unspecified: Secondary | ICD-10-CM | POA: Diagnosis not present

## 2022-03-11 DIAGNOSIS — Z1231 Encounter for screening mammogram for malignant neoplasm of breast: Secondary | ICD-10-CM | POA: Diagnosis not present

## 2022-03-31 DIAGNOSIS — S46211A Strain of muscle, fascia and tendon of other parts of biceps, right arm, initial encounter: Secondary | ICD-10-CM | POA: Diagnosis not present

## 2022-04-01 DIAGNOSIS — S46009A Unspecified injury of muscle(s) and tendon(s) of the rotator cuff of unspecified shoulder, initial encounter: Secondary | ICD-10-CM | POA: Diagnosis not present

## 2022-04-01 DIAGNOSIS — E7800 Pure hypercholesterolemia, unspecified: Secondary | ICD-10-CM | POA: Diagnosis not present

## 2022-04-01 DIAGNOSIS — R030 Elevated blood-pressure reading, without diagnosis of hypertension: Secondary | ICD-10-CM | POA: Diagnosis not present

## 2022-04-10 DIAGNOSIS — S46001A Unspecified injury of muscle(s) and tendon(s) of the rotator cuff of right shoulder, initial encounter: Secondary | ICD-10-CM | POA: Diagnosis not present

## 2022-04-10 DIAGNOSIS — M12811 Other specific arthropathies, not elsewhere classified, right shoulder: Secondary | ICD-10-CM | POA: Diagnosis not present

## 2022-04-11 DIAGNOSIS — M25511 Pain in right shoulder: Secondary | ICD-10-CM | POA: Diagnosis not present

## 2022-04-11 DIAGNOSIS — G2581 Restless legs syndrome: Secondary | ICD-10-CM | POA: Diagnosis not present

## 2022-04-11 DIAGNOSIS — Z79899 Other long term (current) drug therapy: Secondary | ICD-10-CM | POA: Diagnosis not present

## 2022-04-11 DIAGNOSIS — Z1231 Encounter for screening mammogram for malignant neoplasm of breast: Secondary | ICD-10-CM | POA: Diagnosis not present

## 2022-04-11 DIAGNOSIS — G8929 Other chronic pain: Secondary | ICD-10-CM | POA: Diagnosis not present

## 2022-04-11 DIAGNOSIS — E7800 Pure hypercholesterolemia, unspecified: Secondary | ICD-10-CM | POA: Diagnosis not present

## 2022-04-11 DIAGNOSIS — R030 Elevated blood-pressure reading, without diagnosis of hypertension: Secondary | ICD-10-CM | POA: Diagnosis not present

## 2022-04-11 DIAGNOSIS — E559 Vitamin D deficiency, unspecified: Secondary | ICD-10-CM | POA: Diagnosis not present

## 2022-05-13 DIAGNOSIS — Z79899 Other long term (current) drug therapy: Secondary | ICD-10-CM | POA: Diagnosis not present

## 2022-05-13 DIAGNOSIS — N182 Chronic kidney disease, stage 2 (mild): Secondary | ICD-10-CM | POA: Diagnosis not present

## 2022-05-13 DIAGNOSIS — M25511 Pain in right shoulder: Secondary | ICD-10-CM | POA: Diagnosis not present

## 2022-05-13 DIAGNOSIS — G8929 Other chronic pain: Secondary | ICD-10-CM | POA: Diagnosis not present

## 2022-05-13 DIAGNOSIS — Z6822 Body mass index (BMI) 22.0-22.9, adult: Secondary | ICD-10-CM | POA: Diagnosis not present

## 2022-05-13 DIAGNOSIS — G2581 Restless legs syndrome: Secondary | ICD-10-CM | POA: Diagnosis not present

## 2022-05-13 DIAGNOSIS — E7800 Pure hypercholesterolemia, unspecified: Secondary | ICD-10-CM | POA: Diagnosis not present

## 2022-05-13 DIAGNOSIS — Z1231 Encounter for screening mammogram for malignant neoplasm of breast: Secondary | ICD-10-CM | POA: Diagnosis not present

## 2022-05-13 DIAGNOSIS — E559 Vitamin D deficiency, unspecified: Secondary | ICD-10-CM | POA: Diagnosis not present

## 2022-05-15 DIAGNOSIS — Z79899 Other long term (current) drug therapy: Secondary | ICD-10-CM | POA: Diagnosis not present

## 2022-06-12 DIAGNOSIS — Z6822 Body mass index (BMI) 22.0-22.9, adult: Secondary | ICD-10-CM | POA: Diagnosis not present

## 2022-06-12 DIAGNOSIS — G8929 Other chronic pain: Secondary | ICD-10-CM | POA: Diagnosis not present

## 2022-06-12 DIAGNOSIS — M25511 Pain in right shoulder: Secondary | ICD-10-CM | POA: Diagnosis not present

## 2022-06-12 DIAGNOSIS — Z1231 Encounter for screening mammogram for malignant neoplasm of breast: Secondary | ICD-10-CM | POA: Diagnosis not present

## 2022-06-12 DIAGNOSIS — G43909 Migraine, unspecified, not intractable, without status migrainosus: Secondary | ICD-10-CM | POA: Diagnosis not present

## 2022-06-12 DIAGNOSIS — E7800 Pure hypercholesterolemia, unspecified: Secondary | ICD-10-CM | POA: Diagnosis not present

## 2022-06-12 DIAGNOSIS — N182 Chronic kidney disease, stage 2 (mild): Secondary | ICD-10-CM | POA: Diagnosis not present

## 2022-06-12 DIAGNOSIS — Z79899 Other long term (current) drug therapy: Secondary | ICD-10-CM | POA: Diagnosis not present

## 2022-06-12 DIAGNOSIS — E559 Vitamin D deficiency, unspecified: Secondary | ICD-10-CM | POA: Diagnosis not present

## 2022-06-12 DIAGNOSIS — K219 Gastro-esophageal reflux disease without esophagitis: Secondary | ICD-10-CM | POA: Diagnosis not present

## 2022-06-12 DIAGNOSIS — G2581 Restless legs syndrome: Secondary | ICD-10-CM | POA: Diagnosis not present

## 2022-07-12 DIAGNOSIS — G2581 Restless legs syndrome: Secondary | ICD-10-CM | POA: Diagnosis not present

## 2022-07-12 DIAGNOSIS — R5383 Other fatigue: Secondary | ICD-10-CM | POA: Diagnosis not present

## 2022-07-12 DIAGNOSIS — M25511 Pain in right shoulder: Secondary | ICD-10-CM | POA: Diagnosis not present

## 2022-07-12 DIAGNOSIS — G8929 Other chronic pain: Secondary | ICD-10-CM | POA: Diagnosis not present

## 2022-07-12 DIAGNOSIS — Z0000 Encounter for general adult medical examination without abnormal findings: Secondary | ICD-10-CM | POA: Diagnosis not present

## 2022-07-12 DIAGNOSIS — M129 Arthropathy, unspecified: Secondary | ICD-10-CM | POA: Diagnosis not present

## 2022-07-12 DIAGNOSIS — Z6822 Body mass index (BMI) 22.0-22.9, adult: Secondary | ICD-10-CM | POA: Diagnosis not present

## 2022-07-12 DIAGNOSIS — R0602 Shortness of breath: Secondary | ICD-10-CM | POA: Diagnosis not present

## 2022-07-12 DIAGNOSIS — E559 Vitamin D deficiency, unspecified: Secondary | ICD-10-CM | POA: Diagnosis not present

## 2022-07-12 DIAGNOSIS — E7800 Pure hypercholesterolemia, unspecified: Secondary | ICD-10-CM | POA: Diagnosis not present

## 2022-07-12 DIAGNOSIS — Z711 Person with feared health complaint in whom no diagnosis is made: Secondary | ICD-10-CM | POA: Diagnosis not present

## 2022-07-12 DIAGNOSIS — Z79899 Other long term (current) drug therapy: Secondary | ICD-10-CM | POA: Diagnosis not present

## 2022-07-15 DIAGNOSIS — R0602 Shortness of breath: Secondary | ICD-10-CM | POA: Diagnosis not present

## 2022-08-09 DIAGNOSIS — Z6822 Body mass index (BMI) 22.0-22.9, adult: Secondary | ICD-10-CM | POA: Diagnosis not present

## 2022-08-09 DIAGNOSIS — N182 Chronic kidney disease, stage 2 (mild): Secondary | ICD-10-CM | POA: Diagnosis not present

## 2022-08-09 DIAGNOSIS — G2581 Restless legs syndrome: Secondary | ICD-10-CM | POA: Diagnosis not present

## 2022-08-09 DIAGNOSIS — Z79899 Other long term (current) drug therapy: Secondary | ICD-10-CM | POA: Diagnosis not present

## 2022-08-09 DIAGNOSIS — M25511 Pain in right shoulder: Secondary | ICD-10-CM | POA: Diagnosis not present

## 2022-08-09 DIAGNOSIS — E559 Vitamin D deficiency, unspecified: Secondary | ICD-10-CM | POA: Diagnosis not present

## 2022-08-09 DIAGNOSIS — Z1231 Encounter for screening mammogram for malignant neoplasm of breast: Secondary | ICD-10-CM | POA: Diagnosis not present

## 2022-08-09 DIAGNOSIS — E7800 Pure hypercholesterolemia, unspecified: Secondary | ICD-10-CM | POA: Diagnosis not present

## 2022-09-09 DIAGNOSIS — E7800 Pure hypercholesterolemia, unspecified: Secondary | ICD-10-CM | POA: Diagnosis not present

## 2022-09-09 DIAGNOSIS — E559 Vitamin D deficiency, unspecified: Secondary | ICD-10-CM | POA: Diagnosis not present

## 2022-09-09 DIAGNOSIS — Z6822 Body mass index (BMI) 22.0-22.9, adult: Secondary | ICD-10-CM | POA: Diagnosis not present

## 2022-09-09 DIAGNOSIS — M25511 Pain in right shoulder: Secondary | ICD-10-CM | POA: Diagnosis not present

## 2022-09-09 DIAGNOSIS — Z1231 Encounter for screening mammogram for malignant neoplasm of breast: Secondary | ICD-10-CM | POA: Diagnosis not present

## 2022-09-09 DIAGNOSIS — Z79899 Other long term (current) drug therapy: Secondary | ICD-10-CM | POA: Diagnosis not present

## 2022-09-09 DIAGNOSIS — G2581 Restless legs syndrome: Secondary | ICD-10-CM | POA: Diagnosis not present

## 2022-09-09 DIAGNOSIS — N182 Chronic kidney disease, stage 2 (mild): Secondary | ICD-10-CM | POA: Diagnosis not present

## 2022-10-07 DIAGNOSIS — G2581 Restless legs syndrome: Secondary | ICD-10-CM | POA: Diagnosis not present

## 2022-10-07 DIAGNOSIS — E559 Vitamin D deficiency, unspecified: Secondary | ICD-10-CM | POA: Diagnosis not present

## 2022-10-07 DIAGNOSIS — Z1231 Encounter for screening mammogram for malignant neoplasm of breast: Secondary | ICD-10-CM | POA: Diagnosis not present

## 2022-10-07 DIAGNOSIS — Z79899 Other long term (current) drug therapy: Secondary | ICD-10-CM | POA: Diagnosis not present

## 2022-10-07 DIAGNOSIS — E7800 Pure hypercholesterolemia, unspecified: Secondary | ICD-10-CM | POA: Diagnosis not present

## 2022-10-07 DIAGNOSIS — M25511 Pain in right shoulder: Secondary | ICD-10-CM | POA: Diagnosis not present

## 2022-10-07 DIAGNOSIS — N182 Chronic kidney disease, stage 2 (mild): Secondary | ICD-10-CM | POA: Diagnosis not present

## 2022-10-07 DIAGNOSIS — Z6822 Body mass index (BMI) 22.0-22.9, adult: Secondary | ICD-10-CM | POA: Diagnosis not present

## 2022-11-04 DIAGNOSIS — E7800 Pure hypercholesterolemia, unspecified: Secondary | ICD-10-CM | POA: Diagnosis not present

## 2022-11-04 DIAGNOSIS — Z1231 Encounter for screening mammogram for malignant neoplasm of breast: Secondary | ICD-10-CM | POA: Diagnosis not present

## 2022-11-04 DIAGNOSIS — N182 Chronic kidney disease, stage 2 (mild): Secondary | ICD-10-CM | POA: Diagnosis not present

## 2022-11-04 DIAGNOSIS — R1084 Generalized abdominal pain: Secondary | ICD-10-CM | POA: Diagnosis not present

## 2022-11-04 DIAGNOSIS — Z6822 Body mass index (BMI) 22.0-22.9, adult: Secondary | ICD-10-CM | POA: Diagnosis not present

## 2022-11-04 DIAGNOSIS — M25511 Pain in right shoulder: Secondary | ICD-10-CM | POA: Diagnosis not present

## 2022-11-04 DIAGNOSIS — G2581 Restless legs syndrome: Secondary | ICD-10-CM | POA: Diagnosis not present

## 2022-11-04 DIAGNOSIS — E559 Vitamin D deficiency, unspecified: Secondary | ICD-10-CM | POA: Diagnosis not present

## 2022-11-04 DIAGNOSIS — Z79899 Other long term (current) drug therapy: Secondary | ICD-10-CM | POA: Diagnosis not present

## 2022-11-04 DIAGNOSIS — G8929 Other chronic pain: Secondary | ICD-10-CM | POA: Diagnosis not present

## 2022-11-29 DIAGNOSIS — R002 Palpitations: Secondary | ICD-10-CM | POA: Diagnosis not present

## 2022-12-02 DIAGNOSIS — R55 Syncope and collapse: Secondary | ICD-10-CM | POA: Diagnosis not present

## 2022-12-02 DIAGNOSIS — M79605 Pain in left leg: Secondary | ICD-10-CM | POA: Diagnosis not present

## 2022-12-02 DIAGNOSIS — R002 Palpitations: Secondary | ICD-10-CM | POA: Diagnosis not present

## 2022-12-02 DIAGNOSIS — M79604 Pain in right leg: Secondary | ICD-10-CM | POA: Diagnosis not present

## 2022-12-05 DIAGNOSIS — Z1231 Encounter for screening mammogram for malignant neoplasm of breast: Secondary | ICD-10-CM | POA: Diagnosis not present

## 2022-12-05 DIAGNOSIS — N182 Chronic kidney disease, stage 2 (mild): Secondary | ICD-10-CM | POA: Diagnosis not present

## 2022-12-05 DIAGNOSIS — E7800 Pure hypercholesterolemia, unspecified: Secondary | ICD-10-CM | POA: Diagnosis not present

## 2022-12-05 DIAGNOSIS — M25511 Pain in right shoulder: Secondary | ICD-10-CM | POA: Diagnosis not present

## 2022-12-05 DIAGNOSIS — Z6822 Body mass index (BMI) 22.0-22.9, adult: Secondary | ICD-10-CM | POA: Diagnosis not present

## 2022-12-05 DIAGNOSIS — Z79899 Other long term (current) drug therapy: Secondary | ICD-10-CM | POA: Diagnosis not present

## 2022-12-05 DIAGNOSIS — E559 Vitamin D deficiency, unspecified: Secondary | ICD-10-CM | POA: Diagnosis not present

## 2022-12-05 DIAGNOSIS — G8929 Other chronic pain: Secondary | ICD-10-CM | POA: Diagnosis not present

## 2022-12-05 DIAGNOSIS — G2581 Restless legs syndrome: Secondary | ICD-10-CM | POA: Diagnosis not present

## 2022-12-23 DIAGNOSIS — R0602 Shortness of breath: Secondary | ICD-10-CM | POA: Diagnosis not present

## 2022-12-26 DIAGNOSIS — R002 Palpitations: Secondary | ICD-10-CM | POA: Diagnosis not present

## 2022-12-26 DIAGNOSIS — R0602 Shortness of breath: Secondary | ICD-10-CM | POA: Diagnosis not present

## 2022-12-26 DIAGNOSIS — R55 Syncope and collapse: Secondary | ICD-10-CM | POA: Diagnosis not present

## 2023-01-06 DIAGNOSIS — H2513 Age-related nuclear cataract, bilateral: Secondary | ICD-10-CM | POA: Diagnosis not present

## 2023-01-07 DIAGNOSIS — G8929 Other chronic pain: Secondary | ICD-10-CM | POA: Diagnosis not present

## 2023-01-07 DIAGNOSIS — N182 Chronic kidney disease, stage 2 (mild): Secondary | ICD-10-CM | POA: Diagnosis not present

## 2023-01-07 DIAGNOSIS — Z6822 Body mass index (BMI) 22.0-22.9, adult: Secondary | ICD-10-CM | POA: Diagnosis not present

## 2023-01-07 DIAGNOSIS — E7800 Pure hypercholesterolemia, unspecified: Secondary | ICD-10-CM | POA: Diagnosis not present

## 2023-01-07 DIAGNOSIS — Z1231 Encounter for screening mammogram for malignant neoplasm of breast: Secondary | ICD-10-CM | POA: Diagnosis not present

## 2023-01-07 DIAGNOSIS — Z79899 Other long term (current) drug therapy: Secondary | ICD-10-CM | POA: Diagnosis not present

## 2023-01-07 DIAGNOSIS — M79672 Pain in left foot: Secondary | ICD-10-CM | POA: Diagnosis not present

## 2023-01-07 DIAGNOSIS — G43909 Migraine, unspecified, not intractable, without status migrainosus: Secondary | ICD-10-CM | POA: Diagnosis not present

## 2023-01-07 DIAGNOSIS — M25511 Pain in right shoulder: Secondary | ICD-10-CM | POA: Diagnosis not present

## 2023-01-07 DIAGNOSIS — E559 Vitamin D deficiency, unspecified: Secondary | ICD-10-CM | POA: Diagnosis not present

## 2023-01-07 DIAGNOSIS — G2581 Restless legs syndrome: Secondary | ICD-10-CM | POA: Diagnosis not present

## 2023-01-17 DIAGNOSIS — Z1231 Encounter for screening mammogram for malignant neoplasm of breast: Secondary | ICD-10-CM | POA: Diagnosis not present

## 2023-01-22 DIAGNOSIS — M79671 Pain in right foot: Secondary | ICD-10-CM | POA: Diagnosis not present

## 2023-01-22 DIAGNOSIS — M79672 Pain in left foot: Secondary | ICD-10-CM | POA: Diagnosis not present

## 2023-01-22 DIAGNOSIS — M722 Plantar fascial fibromatosis: Secondary | ICD-10-CM | POA: Diagnosis not present

## 2023-01-27 DIAGNOSIS — R002 Palpitations: Secondary | ICD-10-CM | POA: Diagnosis not present

## 2023-02-07 DIAGNOSIS — Z1231 Encounter for screening mammogram for malignant neoplasm of breast: Secondary | ICD-10-CM | POA: Diagnosis not present

## 2023-02-07 DIAGNOSIS — Z6822 Body mass index (BMI) 22.0-22.9, adult: Secondary | ICD-10-CM | POA: Diagnosis not present

## 2023-02-07 DIAGNOSIS — M25511 Pain in right shoulder: Secondary | ICD-10-CM | POA: Diagnosis not present

## 2023-02-07 DIAGNOSIS — G43909 Migraine, unspecified, not intractable, without status migrainosus: Secondary | ICD-10-CM | POA: Diagnosis not present

## 2023-02-07 DIAGNOSIS — M79672 Pain in left foot: Secondary | ICD-10-CM | POA: Diagnosis not present

## 2023-02-07 DIAGNOSIS — G2581 Restless legs syndrome: Secondary | ICD-10-CM | POA: Diagnosis not present

## 2023-02-07 DIAGNOSIS — E7800 Pure hypercholesterolemia, unspecified: Secondary | ICD-10-CM | POA: Diagnosis not present

## 2023-02-07 DIAGNOSIS — Z79899 Other long term (current) drug therapy: Secondary | ICD-10-CM | POA: Diagnosis not present

## 2023-02-07 DIAGNOSIS — E559 Vitamin D deficiency, unspecified: Secondary | ICD-10-CM | POA: Diagnosis not present

## 2023-02-07 DIAGNOSIS — G8929 Other chronic pain: Secondary | ICD-10-CM | POA: Diagnosis not present

## 2023-02-07 DIAGNOSIS — N182 Chronic kidney disease, stage 2 (mild): Secondary | ICD-10-CM | POA: Diagnosis not present

## 2023-02-13 DIAGNOSIS — R928 Other abnormal and inconclusive findings on diagnostic imaging of breast: Secondary | ICD-10-CM | POA: Diagnosis not present

## 2023-03-05 DIAGNOSIS — M722 Plantar fascial fibromatosis: Secondary | ICD-10-CM | POA: Diagnosis not present

## 2023-03-10 DIAGNOSIS — E559 Vitamin D deficiency, unspecified: Secondary | ICD-10-CM | POA: Diagnosis not present

## 2023-03-10 DIAGNOSIS — G2581 Restless legs syndrome: Secondary | ICD-10-CM | POA: Diagnosis not present

## 2023-03-10 DIAGNOSIS — Z6822 Body mass index (BMI) 22.0-22.9, adult: Secondary | ICD-10-CM | POA: Diagnosis not present

## 2023-03-10 DIAGNOSIS — Z79899 Other long term (current) drug therapy: Secondary | ICD-10-CM | POA: Diagnosis not present

## 2023-03-10 DIAGNOSIS — N182 Chronic kidney disease, stage 2 (mild): Secondary | ICD-10-CM | POA: Diagnosis not present

## 2023-03-10 DIAGNOSIS — E7800 Pure hypercholesterolemia, unspecified: Secondary | ICD-10-CM | POA: Diagnosis not present

## 2023-03-10 DIAGNOSIS — Z1231 Encounter for screening mammogram for malignant neoplasm of breast: Secondary | ICD-10-CM | POA: Diagnosis not present

## 2023-03-10 DIAGNOSIS — G43909 Migraine, unspecified, not intractable, without status migrainosus: Secondary | ICD-10-CM | POA: Diagnosis not present

## 2023-04-04 DIAGNOSIS — H16303 Unspecified interstitial keratitis, bilateral: Secondary | ICD-10-CM | POA: Diagnosis not present

## 2023-04-08 DIAGNOSIS — Z6822 Body mass index (BMI) 22.0-22.9, adult: Secondary | ICD-10-CM | POA: Diagnosis not present

## 2023-04-08 DIAGNOSIS — Z79899 Other long term (current) drug therapy: Secondary | ICD-10-CM | POA: Diagnosis not present

## 2023-04-08 DIAGNOSIS — Z23 Encounter for immunization: Secondary | ICD-10-CM | POA: Diagnosis not present

## 2023-04-08 DIAGNOSIS — Z1231 Encounter for screening mammogram for malignant neoplasm of breast: Secondary | ICD-10-CM | POA: Diagnosis not present

## 2023-04-08 DIAGNOSIS — G43909 Migraine, unspecified, not intractable, without status migrainosus: Secondary | ICD-10-CM | POA: Diagnosis not present

## 2023-04-08 DIAGNOSIS — E7800 Pure hypercholesterolemia, unspecified: Secondary | ICD-10-CM | POA: Diagnosis not present

## 2023-04-08 DIAGNOSIS — G2581 Restless legs syndrome: Secondary | ICD-10-CM | POA: Diagnosis not present

## 2023-04-08 DIAGNOSIS — Z8679 Personal history of other diseases of the circulatory system: Secondary | ICD-10-CM | POA: Diagnosis not present

## 2023-04-08 DIAGNOSIS — N182 Chronic kidney disease, stage 2 (mild): Secondary | ICD-10-CM | POA: Diagnosis not present

## 2023-04-08 DIAGNOSIS — E559 Vitamin D deficiency, unspecified: Secondary | ICD-10-CM | POA: Diagnosis not present

## 2023-04-16 DIAGNOSIS — M722 Plantar fascial fibromatosis: Secondary | ICD-10-CM | POA: Diagnosis not present

## 2023-04-23 DIAGNOSIS — M722 Plantar fascial fibromatosis: Secondary | ICD-10-CM | POA: Diagnosis not present

## 2023-04-24 DIAGNOSIS — I471 Supraventricular tachycardia, unspecified: Secondary | ICD-10-CM | POA: Diagnosis not present

## 2023-04-24 DIAGNOSIS — R9431 Abnormal electrocardiogram [ECG] [EKG]: Secondary | ICD-10-CM | POA: Diagnosis not present

## 2023-04-24 DIAGNOSIS — E785 Hyperlipidemia, unspecified: Secondary | ICD-10-CM | POA: Diagnosis not present

## 2023-05-09 DIAGNOSIS — G43909 Migraine, unspecified, not intractable, without status migrainosus: Secondary | ICD-10-CM | POA: Diagnosis not present

## 2023-05-09 DIAGNOSIS — E7800 Pure hypercholesterolemia, unspecified: Secondary | ICD-10-CM | POA: Diagnosis not present

## 2023-05-09 DIAGNOSIS — Z1231 Encounter for screening mammogram for malignant neoplasm of breast: Secondary | ICD-10-CM | POA: Diagnosis not present

## 2023-05-09 DIAGNOSIS — N182 Chronic kidney disease, stage 2 (mild): Secondary | ICD-10-CM | POA: Diagnosis not present

## 2023-05-09 DIAGNOSIS — Z79899 Other long term (current) drug therapy: Secondary | ICD-10-CM | POA: Diagnosis not present

## 2023-05-09 DIAGNOSIS — Z6822 Body mass index (BMI) 22.0-22.9, adult: Secondary | ICD-10-CM | POA: Diagnosis not present

## 2023-05-09 DIAGNOSIS — E559 Vitamin D deficiency, unspecified: Secondary | ICD-10-CM | POA: Diagnosis not present

## 2023-05-09 DIAGNOSIS — G2581 Restless legs syndrome: Secondary | ICD-10-CM | POA: Diagnosis not present

## 2023-05-19 DIAGNOSIS — M722 Plantar fascial fibromatosis: Secondary | ICD-10-CM | POA: Diagnosis not present

## 2023-06-06 DIAGNOSIS — Z79899 Other long term (current) drug therapy: Secondary | ICD-10-CM | POA: Diagnosis not present

## 2023-06-06 DIAGNOSIS — Z6822 Body mass index (BMI) 22.0-22.9, adult: Secondary | ICD-10-CM | POA: Diagnosis not present

## 2023-06-06 DIAGNOSIS — M545 Low back pain, unspecified: Secondary | ICD-10-CM | POA: Diagnosis not present

## 2023-06-06 DIAGNOSIS — G2581 Restless legs syndrome: Secondary | ICD-10-CM | POA: Diagnosis not present

## 2023-06-06 DIAGNOSIS — E559 Vitamin D deficiency, unspecified: Secondary | ICD-10-CM | POA: Diagnosis not present

## 2023-06-06 DIAGNOSIS — E7800 Pure hypercholesterolemia, unspecified: Secondary | ICD-10-CM | POA: Diagnosis not present

## 2023-06-06 DIAGNOSIS — Z1231 Encounter for screening mammogram for malignant neoplasm of breast: Secondary | ICD-10-CM | POA: Diagnosis not present

## 2023-06-06 DIAGNOSIS — G8929 Other chronic pain: Secondary | ICD-10-CM | POA: Diagnosis not present

## 2023-06-11 DIAGNOSIS — M722 Plantar fascial fibromatosis: Secondary | ICD-10-CM | POA: Diagnosis not present

## 2023-06-16 DIAGNOSIS — M722 Plantar fascial fibromatosis: Secondary | ICD-10-CM | POA: Diagnosis not present

## 2023-06-23 DIAGNOSIS — M722 Plantar fascial fibromatosis: Secondary | ICD-10-CM | POA: Diagnosis not present
